# Patient Record
Sex: Female | Born: 1940 | Race: White | Hispanic: No | Marital: Married | Smoking: Never smoker
Health system: Southern US, Community
[De-identification: ages and names within clinical notes are randomized; demographics above are authoritative.]

## PROBLEM LIST (undated history)

## (undated) DIAGNOSIS — Z8619 Personal history of other infectious and parasitic diseases: Secondary | ICD-10-CM

## (undated) DIAGNOSIS — I629 Nontraumatic intracranial hemorrhage, unspecified: Secondary | ICD-10-CM

## (undated) DIAGNOSIS — Z862 Personal history of diseases of the blood and blood-forming organs and certain disorders involving the immune mechanism: Secondary | ICD-10-CM

## (undated) DIAGNOSIS — N816 Rectocele: Secondary | ICD-10-CM

## (undated) DIAGNOSIS — M549 Dorsalgia, unspecified: Secondary | ICD-10-CM

## (undated) DIAGNOSIS — I639 Cerebral infarction, unspecified: Secondary | ICD-10-CM

## (undated) DIAGNOSIS — E785 Hyperlipidemia, unspecified: Secondary | ICD-10-CM

## (undated) DIAGNOSIS — M5414 Radiculopathy, thoracic region: Secondary | ICD-10-CM

## (undated) DIAGNOSIS — G8929 Other chronic pain: Secondary | ICD-10-CM

## (undated) DIAGNOSIS — R410 Disorientation, unspecified: Secondary | ICD-10-CM

## (undated) DIAGNOSIS — Z0389 Encounter for observation for other suspected diseases and conditions ruled out: Secondary | ICD-10-CM

## (undated) DIAGNOSIS — Q211 Atrial septal defect, unspecified: Secondary | ICD-10-CM

## (undated) DIAGNOSIS — G463 Brain stem stroke syndrome: Secondary | ICD-10-CM

## (undated) HISTORY — DX: Atrial septal defect: Q21.1

## (undated) HISTORY — DX: Other chronic pain: G89.29

## (undated) HISTORY — DX: Rectocele: N81.6

## (undated) HISTORY — PX: COLONOSCOPY W/ POLYPECTOMY: SHX1380

## (undated) HISTORY — DX: Nontraumatic intracranial hemorrhage, unspecified: I62.9

## (undated) HISTORY — DX: Personal history of diseases of the blood and blood-forming organs and certain disorders involving the immune mechanism: Z86.2

## (undated) HISTORY — DX: Radiculopathy, thoracic region: M54.14

## (undated) HISTORY — PX: BLADDER NECK RECONSTRUCTION: SHX1239

## (undated) HISTORY — DX: Brain stem stroke syndrome: G46.3

## (undated) HISTORY — PX: OTHER SURGICAL HISTORY: SHX169

## (undated) HISTORY — PX: SHOULDER SURGERY: SHX246

## (undated) HISTORY — DX: Atrial septal defect, unspecified: Q21.10

## (undated) HISTORY — PX: BACK SURGERY: SHX140

## (undated) HISTORY — DX: Hyperlipidemia, unspecified: E78.5

## (undated) HISTORY — DX: Cerebral infarction, unspecified: I63.9

## (undated) HISTORY — DX: Dorsalgia, unspecified: M54.9

## (undated) HISTORY — DX: Personal history of other infectious and parasitic diseases: Z86.19

## (undated) HISTORY — DX: Disorientation, unspecified: R41.0

## (undated) HISTORY — PX: COLON SURGERY: SHX602

## (undated) HISTORY — DX: Encounter for observation for other suspected diseases and conditions ruled out: Z03.89

---

## 2001-07-05 ENCOUNTER — Encounter: Payer: Self-pay | Admitting: Orthopedic Surgery

## 2001-07-05 ENCOUNTER — Ambulatory Visit (HOSPITAL_COMMUNITY): Admission: RE | Admit: 2001-07-05 | Discharge: 2001-07-05 | Payer: Self-pay | Admitting: Orthopedic Surgery

## 2004-04-30 ENCOUNTER — Ambulatory Visit (HOSPITAL_COMMUNITY): Admission: RE | Admit: 2004-04-30 | Discharge: 2004-04-30 | Payer: Self-pay | Admitting: Neurosurgery

## 2004-05-06 ENCOUNTER — Ambulatory Visit (HOSPITAL_COMMUNITY): Admission: RE | Admit: 2004-05-06 | Discharge: 2004-05-07 | Payer: Self-pay | Admitting: Neurosurgery

## 2006-11-16 IMAGING — CT CT L SPINE W/ CM
3 of 11 series · 10 of 33 positions shown, 12 images · non-contrast
Comparison: none

CLINICAL DATA: Left flank pain.
LUMBAR MYELOGRAM:
TECHNIQUE: Instillation of contrast was performed by Dr. Aliyev. Mild levoscoliosis lumbar spine.  No specific nerve root compression.  Mild ventral impression upon the thecal sac L2-3, L3-4 and L4-5. No abnormal motion occurs between flexion and extension.

[Series 2: l-spine helical · axial · 0.27mm/px · z∈[-296,-66]mm · 2 of 93 slices shown, 3 images]
[im 1/93  soft-tissue]
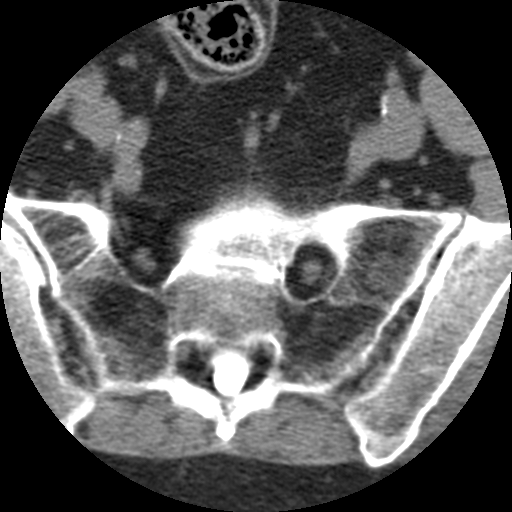
[im 1/93  bone]
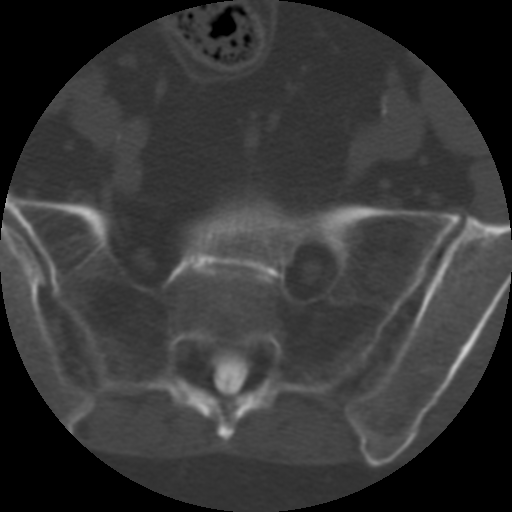
[im 93/93  bone]
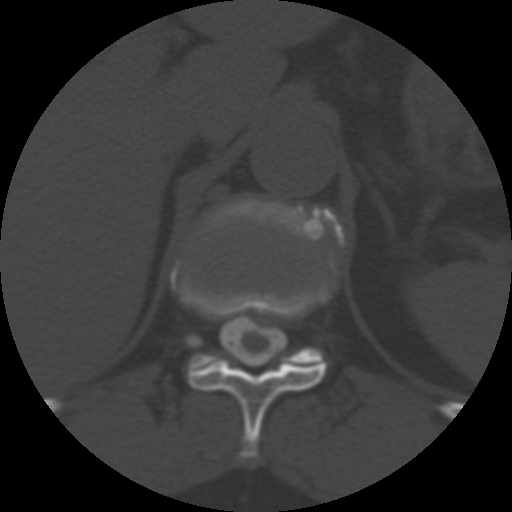

[Series 105: reformatted · sagittal · 0.39mm/px · 5 of 63 slices shown, 6 images (1 of 2)]
[im 21/63  bone]
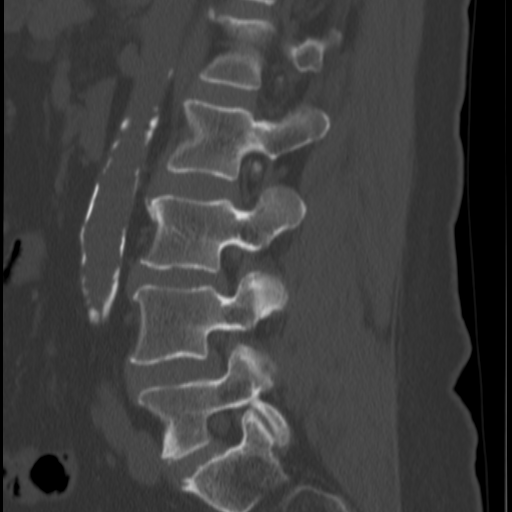
[im 26/63  bone]
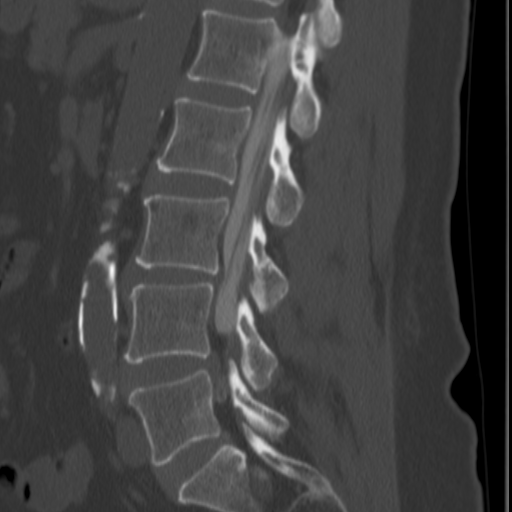
[im 32/63  soft-tissue]
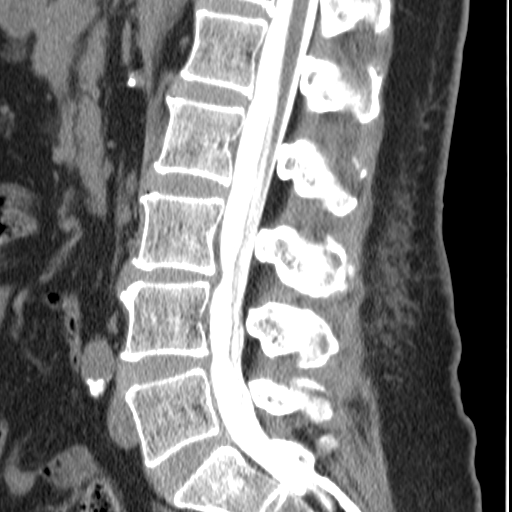
[im 32/63  bone]
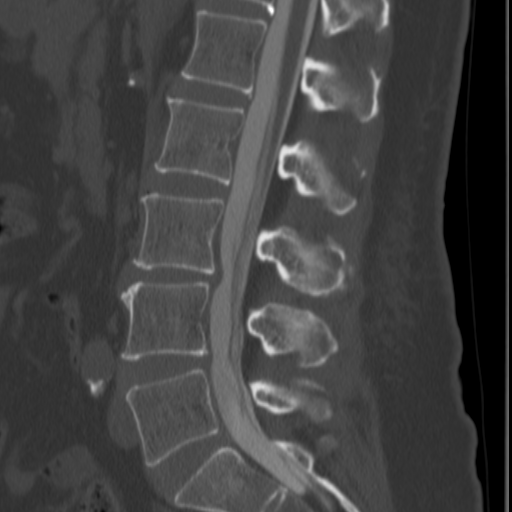
[im 37/63  bone]
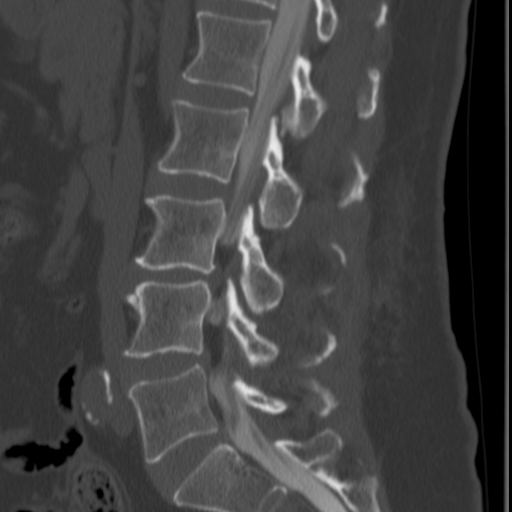
[im 42/63  bone]
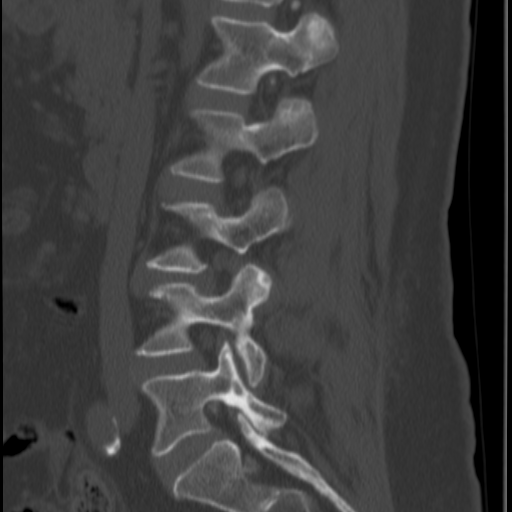

[Series 106: reformatted · coronal · 0.39mm/px · 3 of 63 slices shown (2 of 2)]
[im 13/63  bone]
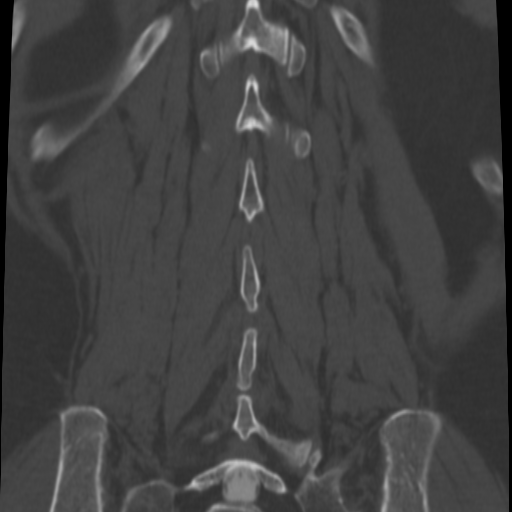
[im 25/63  bone]
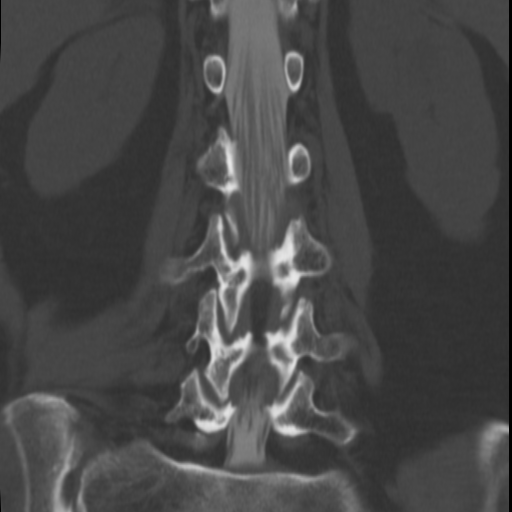
[im 38/63  bone]
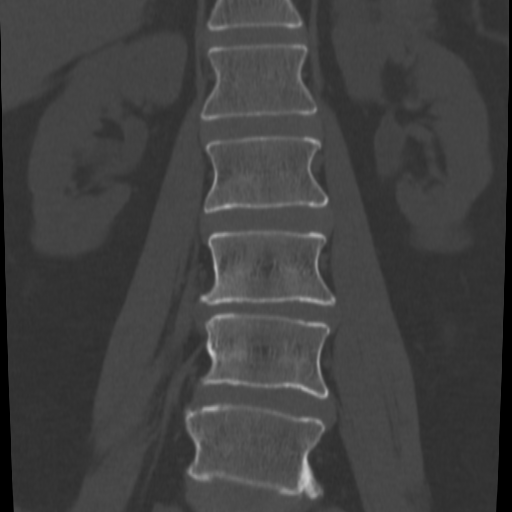

[10 of 33 positions shown; findings below may reference images not displayed]

IMPRESSION: Mild ventral impression upon the thecal sac L2-3, L3-4 and L4-5 without specific nerve root compression. No abnormal motion.   Please see CT report.
POST MYELOGRAM CT LUMBAR SPINE:
Multiplanar reformatted CT images were reconstructed from the axial CT data set. These images were reviewed and pertinent findings are included in the accompanying complete CT report. 
Present examination incorporates from lower aspect of the T11-12 disk space through the upper sacrum.  
Incidentally noted is a calcified aorta with mild ectasia. Small calcification upper pole left kidney incompletely evaluated on present exam. Conus slightly low-lying at the lower L-2 level.
T11-12: Limited imaging through this disk space reveals mild bulge as well as a slightly moderate sized left posterolateral disk protrusion with mass effect upon the adjacent thecal sac and minimal cord deformity without significant compression of such.
T12-L1:  Negative.
L1-2:  Mild bilateral facet joint degenerative changes.
L2-3:  Mild bilateral facet joint degenerative changes.
L3-4:  Moderate bulge/broad based protrusion with greater extension right lateral position. This encroaches upon the coursing exiting right L-3 nerve root which does not appear to be significantly compressed or displaced. Facet joint bony overgrowth and ligamentum flavum hypertrophy with right greater than left moderate spinal stenosis.  
L4-5:  Rotation with moderate bulge.  Additionally, there is a small extruded disk in the left foramen which extends cephalad from the disk space level towards the medial aspect of the left L-4 pedicle and is causing a mild impression upon the left L-4 nerve root. Moderate bilateral facet joint degenerative changes.  Moderate spinal stenosis (left greater than right).
L5-S1:  Moderate bulge.  Moderate bilateral facet joint degenerative changes. Minimal bilateral foraminal narrowing without nerve root compression.
IMPRESSION: In this patient who is presenting with left side symptoms, most significant finding may be the small extruded left L4-5 disk and associated mass effect as described above.
T11-12 left posterolateral slightly moderate sized disk protrusion with deformity of adjacent thecal sac and perhaps minimal impression upon the adjacent spinal cord.
L3-4 multifactorial moderate spinal stenosis, right greater than left. Please see above.

## 2010-10-14 DIAGNOSIS — I6330 Cerebral infarction due to thrombosis of unspecified cerebral artery: Secondary | ICD-10-CM

## 2010-10-14 HISTORY — DX: Cerebral infarction due to thrombosis of unspecified cerebral artery: I63.30

## 2015-04-08 DIAGNOSIS — E041 Nontoxic single thyroid nodule: Secondary | ICD-10-CM | POA: Insufficient documentation

## 2015-04-08 DIAGNOSIS — Z8774 Personal history of (corrected) congenital malformations of heart and circulatory system: Secondary | ICD-10-CM

## 2015-04-08 DIAGNOSIS — M8580 Other specified disorders of bone density and structure, unspecified site: Secondary | ICD-10-CM

## 2015-04-08 DIAGNOSIS — F339 Major depressive disorder, recurrent, unspecified: Secondary | ICD-10-CM

## 2015-04-08 DIAGNOSIS — F5101 Primary insomnia: Secondary | ICD-10-CM | POA: Insufficient documentation

## 2015-04-08 DIAGNOSIS — F419 Anxiety disorder, unspecified: Secondary | ICD-10-CM | POA: Insufficient documentation

## 2015-04-08 DIAGNOSIS — I10 Essential (primary) hypertension: Secondary | ICD-10-CM

## 2015-04-08 DIAGNOSIS — E782 Mixed hyperlipidemia: Secondary | ICD-10-CM

## 2015-04-08 DIAGNOSIS — K219 Gastro-esophageal reflux disease without esophagitis: Secondary | ICD-10-CM | POA: Insufficient documentation

## 2015-04-08 DIAGNOSIS — G43909 Migraine, unspecified, not intractable, without status migrainosus: Secondary | ICD-10-CM | POA: Insufficient documentation

## 2015-04-08 DIAGNOSIS — I2510 Atherosclerotic heart disease of native coronary artery without angina pectoris: Secondary | ICD-10-CM

## 2015-04-08 DIAGNOSIS — Z79899 Other long term (current) drug therapy: Secondary | ICD-10-CM | POA: Insufficient documentation

## 2015-04-08 HISTORY — DX: Anxiety disorder, unspecified: F41.9

## 2015-04-08 HISTORY — DX: Gastro-esophageal reflux disease without esophagitis: K21.9

## 2015-04-08 HISTORY — DX: Nontoxic single thyroid nodule: E04.1

## 2015-04-08 HISTORY — DX: Essential (primary) hypertension: I10

## 2015-04-08 HISTORY — DX: Primary insomnia: F51.01

## 2015-04-08 HISTORY — DX: Personal history of (corrected) congenital malformations of heart and circulatory system: Z87.74

## 2015-04-08 HISTORY — DX: Other long term (current) drug therapy: Z79.899

## 2015-04-08 HISTORY — DX: Other specified disorders of bone density and structure, unspecified site: M85.80

## 2015-04-08 HISTORY — DX: Major depressive disorder, recurrent, unspecified: F33.9

## 2015-04-08 HISTORY — DX: Mixed hyperlipidemia: E78.2

## 2015-04-08 HISTORY — DX: Atherosclerotic heart disease of native coronary artery without angina pectoris: I25.10

## 2015-04-08 HISTORY — DX: Migraine, unspecified, not intractable, without status migrainosus: G43.909

## 2015-06-03 DIAGNOSIS — R42 Dizziness and giddiness: Secondary | ICD-10-CM

## 2015-06-03 HISTORY — DX: Dizziness and giddiness: R42

## 2015-09-10 DIAGNOSIS — E785 Hyperlipidemia, unspecified: Secondary | ICD-10-CM | POA: Insufficient documentation

## 2015-09-10 DIAGNOSIS — Z01810 Encounter for preprocedural cardiovascular examination: Secondary | ICD-10-CM

## 2015-09-10 HISTORY — DX: Encounter for preprocedural cardiovascular examination: Z01.810

## 2015-09-11 DIAGNOSIS — R5381 Other malaise: Secondary | ICD-10-CM

## 2015-09-11 DIAGNOSIS — R5383 Other fatigue: Secondary | ICD-10-CM

## 2015-09-11 HISTORY — DX: Other malaise: R53.81

## 2015-12-13 DIAGNOSIS — K5730 Diverticulosis of large intestine without perforation or abscess without bleeding: Secondary | ICD-10-CM | POA: Insufficient documentation

## 2015-12-13 HISTORY — DX: Diverticulosis of large intestine without perforation or abscess without bleeding: K57.30

## 2016-09-16 DIAGNOSIS — Z8673 Personal history of transient ischemic attack (TIA), and cerebral infarction without residual deficits: Secondary | ICD-10-CM

## 2016-09-16 DIAGNOSIS — A6000 Herpesviral infection of urogenital system, unspecified: Secondary | ICD-10-CM

## 2016-09-16 HISTORY — DX: Personal history of transient ischemic attack (TIA), and cerebral infarction without residual deficits: Z86.73

## 2016-09-16 HISTORY — DX: Herpesviral infection of urogenital system, unspecified: A60.00

## 2016-12-09 DIAGNOSIS — K5909 Other constipation: Secondary | ICD-10-CM

## 2016-12-09 HISTORY — DX: Other constipation: K59.09

## 2017-03-19 DIAGNOSIS — M5136 Other intervertebral disc degeneration, lumbar region: Secondary | ICD-10-CM | POA: Insufficient documentation

## 2017-03-19 HISTORY — DX: Other intervertebral disc degeneration, lumbar region: M51.36

## 2018-04-14 ENCOUNTER — Encounter: Payer: Self-pay | Admitting: Cardiology

## 2018-04-14 ENCOUNTER — Ambulatory Visit: Payer: Medicare Other | Admitting: Cardiology

## 2018-04-14 VITALS — BP 124/62 | HR 53 | Ht 65.0 in | Wt 165.0 lb

## 2018-04-14 DIAGNOSIS — E782 Mixed hyperlipidemia: Secondary | ICD-10-CM | POA: Diagnosis not present

## 2018-04-14 DIAGNOSIS — I10 Essential (primary) hypertension: Secondary | ICD-10-CM

## 2018-04-14 DIAGNOSIS — I2510 Atherosclerotic heart disease of native coronary artery without angina pectoris: Secondary | ICD-10-CM

## 2018-04-14 DIAGNOSIS — Z8774 Personal history of (corrected) congenital malformations of heart and circulatory system: Secondary | ICD-10-CM

## 2018-04-14 DIAGNOSIS — I209 Angina pectoris, unspecified: Secondary | ICD-10-CM

## 2018-04-14 HISTORY — DX: Angina pectoris, unspecified: I20.9

## 2018-04-14 LAB — BASIC METABOLIC PANEL
BUN/Creatinine Ratio: 18 (ref 12–28)
BUN: 17 mg/dL (ref 8–27)
CO2: 21 mmol/L (ref 20–29)
Calcium: 9.8 mg/dL (ref 8.7–10.3)
Chloride: 104 mmol/L (ref 96–106)
Creatinine, Ser: 0.97 mg/dL (ref 0.57–1.00)
GFR calc Af Amer: 65 mL/min/{1.73_m2} (ref >59)
GFR, EST NON AFRICAN AMERICAN: 56 mL/min/{1.73_m2} — AB (ref >59)
Glucose: 98 mg/dL (ref 65–99)
POTASSIUM: 4.6 mmol/L (ref 3.5–5.2)
Sodium: 138 mmol/L (ref 134–144)

## 2018-04-14 LAB — CBC
Hematocrit: 38.9 % (ref 34.0–46.6)
Hemoglobin: 13.3 g/dL (ref 11.1–15.9)
MCH: 30.3 pg (ref 26.6–33.0)
MCHC: 34.2 g/dL (ref 31.5–35.7)
MCV: 89 fL (ref 79–97)
Platelets: 256 10*3/uL (ref 150–450)
RBC: 4.39 x10E6/uL (ref 3.77–5.28)
RDW: 13.5 % (ref 11.7–15.4)
WBC: 5.3 10*3/uL (ref 3.4–10.8)

## 2018-04-14 MED ORDER — NITROGLYCERIN 0.4 MG SL SUBL: 0.4000 mg | SUBLINGUAL_TABLET | SUBLINGUAL | 11 refills | Status: DC | PRN

## 2018-04-14 NOTE — H&P (View-Only) (Signed)
Cardiology Office Note:    Date:  04/14/2018   ID:  Megan SchillerBarbara L Mcgrath, DOB 05/04/1940, MRN [id number]  PCP:  [name], [name], MD  Cardiologist:  Garwin Brothersajan R Janayia Burggraf, MD   Referring MD: [name], [name], MD    ASSESSMENT:    1. Angina pectoris (HCC)   2. Essential (primary) hypertension   3. Mixed hyperlipidemia   4. S/P percutaneous patent foramen ovale closure   5. Coronary artery disease involving native coronary artery of native heart, angina presence unspecified    PLAN:    In order of problems listed above:  1. Secondary prevention stressed with the patient.  Importance of compliance with diet and medication stressed and she vocalized understanding. 2. Diet was discussed for dyslipidemia and she is on statin therapy.  Her blood pressure is good I cannot initiate her on beta-blocker because she is already having a low heart rate. 3. Her symptoms are very concerning.In view of the patient's symptoms, I discussed with the patient options for evaluation. Invasive and noninvasive options were given to the patient. I discussed stress testing and coronary angiography and left heart catheterization at length. Benefits, pros and cons of each approach were discussed at length. Patient had multiple questions which were answered to the patient's satisfaction. Patient opted for invasive evaluation and we will set up for coronary angiography and left heart catheterization. Further recommendations will be made based on the findings with coronary angiography. In the interim if the patient has any significant symptoms in hospital to the nearest emergency room.    Medication Adjustments/Labs and Tests Ordered: Current medicines are reviewed at length with the patient today.  Concerns regarding medicines are outlined above.  No orders of the defined types were placed in this encounter.  No orders of the defined types were placed in this encounter.    No chief complaint on file.    History of  Present Illness:    Megan Mcgrath is a 78 y.o. female.  This patient has been under my care in my previous practice.  He is here now to transfer his care and be established with my current practice.  He has history of coronary artery disease nonobstructive in nature about 4 years ago by coronary angiography.  She has essential hypertension and dyslipidemia.  She is referred here as she complains of chest tightness on exertion.  She tells me that consistently when she goes up to her mailbox she has chest tightness going to the neck.  She has had nitroglycerin and used it with relief.  For this reason she is here for an evaluation.  At the time of my evaluation, the patient is alert awake oriented and in no distress.  Past Medical History:  Diagnosis Date  . Atrial septal defect   . Brain stem stroke syndrome   . Chronic back pain   . Delirium   . Dyslipidemia   . H/O herpes genitalis   . History of anemia   . Nontraumatic intracranial hemorrhage (HCC)   . Observation for suspected malignant neoplasm   . Rectocele   . Stroke (HCC)   . Thoracic neuritis     Past Surgical History:  Procedure Laterality Date  . BACK SURGERY    . BLADDER NECK RECONSTRUCTION    . COLON SURGERY    . COLONOSCOPY W/ POLYPECTOMY    . HYSTERECTOMY    . repair congenital atrial septal defect    . SHOULDER SURGERY      Current  Medications: Current Meds  Medication Sig  . acebutolol (SECTRAL) 200 MG capsule Take 200 mg by mouth 2 (two) times daily.  . ACETAMINOPHEN-BUTALBITAL 50-325 MG TABS 1\2 tablet twice a day  . ALPRAZolam (XANAX) 0.5 MG tablet Take 0.5 mg by mouth every 8 (eight) hours as needed.  Marland Kitchen aspirin 81 MG tablet Take 81 mg by mouth daily.  Marland Kitchen atorvastatin (LIPITOR) 20 MG tablet Take 20 mg by mouth daily.  . Calcium Carbonate-Vitamin D (CALCIUM HIGH POTENCY/VITAMIN D) 600-200 MG-UNIT TABS Take 1 tablet by mouth daily.  . Cholecalciferol (VITAMIN D-1000 MAX ST) 25 MCG (1000 UT) tablet Take 1,000  Units by mouth daily.  Marland Kitchen lisinopril (PRINIVIL,ZESTRIL) 10 MG tablet Take 10 mg by mouth 2 (two) times daily.  . meclizine (ANTIVERT) 25 MG tablet Take 25 mg by mouth every 8 (eight) hours as needed.  . nitroGLYCERIN (NITROSTAT) 0.4 MG SL tablet Place 0.4 mg under the tongue every 5 (five) minutes as needed.  . Omega-3 1000 MG CAPS Take 1,000 capsules by mouth daily.  Marland Kitchen omeprazole (PRILOSEC) 40 MG capsule Take 40 mg by mouth daily.  . valACYclovir (VALTREX) 500 MG tablet Take 500 mg by mouth 2 (two) times daily.     Allergies:   Patient has no known allergies.   Social History   Socioeconomic History  . Marital status: Married    Spouse name: Not on file  . Number of children: Not on file  . Years of education: Not on file  . Highest education level: Not on file  Occupational History  . Not on file  Social Needs  . Financial resource strain: Not on file  . Food insecurity:    Worry: Not on file    Inability: Not on file  . Transportation needs:    Medical: Not on file    Non-medical: Not on file  Tobacco Use  . Smoking status: Never Smoker  . Smokeless tobacco: Never Used  Substance and Sexual Activity  . Alcohol use: Never    Frequency: Never  . Drug use: Never  . Sexual activity: Not on file  Lifestyle  . Physical activity:    Days per week: Not on file    Minutes per session: Not on file  . Stress: Not on file  Relationships  . Social connections:    Talks on phone: Not on file    Gets together: Not on file    Attends religious service: Not on file    Active member of club or organization: Not on file    Attends meetings of clubs or organizations: Not on file    Relationship status: Not on file  Other Topics Concern  . Not on file  Social History Narrative  . Not on file     Family History: The patient's family history includes Heart disease in her father, maternal grandmother, and mother.  ROS:   Please see the history of present illness.    All other  systems reviewed and are negative.  EKGs/Labs/Other Studies Reviewed:    The following studies were reviewed today: I discussed my findings with the patient at extensive length.  EKG reveals sinus rhythm and nonspecific ST-T changes.   Recent Labs: No results found for requested labs within last 8760 hours.  Recent Lipid Panel No results found for: CHOL, TRIG, HDL, CHOLHDL, VLDL, LDLCALC, LDLDIRECT  Physical Exam:    VS:  BP 124/62 (BP Location: Right Arm, Patient Position: Sitting, Cuff Size: Normal)   Pulse Marland Kitchen)  53   Ht 5\' 5"  (1.651 m)   Wt 165 lb (74.8 kg)   SpO2 98%   BMI 27.46 kg/m     Wt Readings from Last 3 Encounters:  04/14/18 165 lb (74.8 kg)     GEN: Patient is in no acute distress HEENT: Normal NECK: No JVD; No carotid bruits LYMPHATICS: No lymphadenopathy CARDIAC: Hear sounds regular, 2/6 systolic murmur at the apex. RESPIRATORY:  Clear to auscultation without rales, wheezing or rhonchi  ABDOMEN: Soft, non-tender, non-distended MUSCULOSKELETAL:  No edema; No deformity  SKIN: Warm and dry NEUROLOGIC:  Alert and oriented x 3 PSYCHIATRIC:  Normal affect   Signed, Garwin Brothers, MD  04/14/2018 9:24 AM    [facility]

## 2018-04-14 NOTE — Patient Instructions (Signed)
Medication Instructions:   Your physician recommends that you continue on your current medications as directed. Please refer to the Current Medication list given to you today.   If you need a refill on your cardiac medications before your next appointment, please call your pharmacy.   Lab work:  Your physician recommends that you return for lab work today: BMP, CBC   If you have labs (blood work) drawn today and your tests are completely normal, you will receive your results only by: Marland Kitchen MyChart Message (if you have MyChart) OR . A paper copy in the mail If you have any lab test that is abnormal or we need to change your treatment, we will call you to review the results.  Testing/Procedures:  A chest x-ray takes a picture of the organs and structures inside the chest, including the heart, lungs, and blood vessels. This test can show several things, including, whether the heart is enlarges; whether fluid is building up in the lungs; and whether pacemaker / defibrillator leads are still in place.     [facility] CARDIOVASCULAR DIVISION [facility] [place] [address] [place] [address] Dept: [phone] Loc: [phone]  Megan Mcgrath  04/14/2018  You are scheduled for a Cardiac Catheterization on Tuesday, February 18 with Dr. Lance Muss.  1. Please arrive at the [facility] (Main Entrance A) at [facility]: [address] [place], [place] [address] at 7:00 AM (This time is two hours before your procedure to ensure your preparation). Free valet parking service is available.   Special note: Every effort is made to have your procedure done on time. Please understand that emergencies sometimes delay scheduled procedures.  2. Diet: Do not eat solid foods after midnight.  The patient may have clear liquids until 5am upon the day of the procedure.  3. Labs: You will need to have blood drawn today: CBC, BMP  4. Medication  instructions in preparation for your procedure:   Contrast Allergy: No   On the morning of your procedure, take your Aspirin and any morning medicines NOT listed above.  You may use sips of water.  5. Plan for one night stay--bring personal belongings. 6. Bring a current list of your medications and current insurance cards. 7. You MUST have a responsible person to drive you home. 8. Someone MUST be with you the first 24 hours after you arrive home or your discharge will be delayed. 9. Please wear clothes that are easy to get on and off and wear slip-on shoes.  Thank you for allowing [place] to care for you!   -- [facility]   Follow-Up: At [facility], you and your health needs are our priority.  As part of our continuing mission to provide you with exceptional heart care, we have created designated Provider Care Teams.  These Care Teams include your primary Cardiologist (physician) and Advanced Practice Providers (APPs -  Physician Assistants and Nurse Practitioners) who all work together to provide you with the care you need, when you need it.   You will need a follow up appointment in 1 months.

## 2018-04-14 NOTE — Progress Notes (Signed)
Cardiology Office Note:    Date:  04/14/2018   ID:  Megan Mcgrath, DOB 05/09/1940, MRN [id number]  PCP:  Grisso, [name], MD  Cardiologist:  Anias Bartol R Nathanyal Ashmead, MD   Referring MD: Grisso, [name], MD    ASSESSMENT:    1. Angina pectoris (HCC)   2. Essential (primary) hypertension   3. Mixed hyperlipidemia   4. S/P percutaneous patent foramen ovale closure   5. Coronary artery disease involving native coronary artery of native heart, angina presence unspecified    PLAN:    In order of problems listed above:  1. Secondary prevention stressed with the patient.  Importance of compliance with diet and medication stressed and she vocalized understanding. 2. Diet was discussed for dyslipidemia and she is on statin therapy.  Her blood pressure is good I cannot initiate her on beta-blocker because she is already having a low heart rate. 3. Her symptoms are very concerning.In view of the patient's symptoms, I discussed with the patient options for evaluation. Invasive and noninvasive options were given to the patient. I discussed stress testing and coronary angiography and left heart catheterization at length. Benefits, pros and cons of each approach were discussed at length. Patient had multiple questions which were answered to the patient's satisfaction. Patient opted for invasive evaluation and we will set up for coronary angiography and left heart catheterization. Further recommendations will be made based on the findings with coronary angiography. In the interim if the patient has any significant symptoms in hospital to the nearest emergency room.    Medication Adjustments/Labs and Tests Ordered: Current medicines are reviewed at length with the patient today.  Concerns regarding medicines are outlined above.  No orders of the defined types were placed in this encounter.  No orders of the defined types were placed in this encounter.    No chief complaint on file.    History of  Present Illness:    Megan Mcgrath is a 78 y.o. female.  This patient has been under my care in my previous practice.  He is here now to transfer his care and be established with my current practice.  He has history of coronary artery disease nonobstructive in nature about 4 years ago by coronary angiography.  She has essential hypertension and dyslipidemia.  She is referred here as she complains of chest tightness on exertion.  She tells me that consistently when she goes up to her mailbox she has chest tightness going to the neck.  She has had nitroglycerin and used it with relief.  For this reason she is here for an evaluation.  At the time of my evaluation, the patient is alert awake oriented and in no distress.  Past Medical History:  Diagnosis Date  . Atrial septal defect   . Brain stem stroke syndrome   . Chronic back pain   . Delirium   . Dyslipidemia   . H/O herpes genitalis   . History of anemia   . Nontraumatic intracranial hemorrhage (HCC)   . Observation for suspected malignant neoplasm   . Rectocele   . Stroke (HCC)   . Thoracic neuritis     Past Surgical History:  Procedure Laterality Date  . BACK SURGERY    . BLADDER NECK RECONSTRUCTION    . COLON SURGERY    . COLONOSCOPY W/ POLYPECTOMY    . HYSTERECTOMY    . repair congenital atrial septal defect    . SHOULDER SURGERY      Current   Medications: Current Meds  Medication Sig  . acebutolol (SECTRAL) 200 MG capsule Take 200 mg by mouth 2 (two) times daily.  . ACETAMINOPHEN-BUTALBITAL 50-325 MG TABS 1\2 tablet twice a day  . ALPRAZolam (XANAX) 0.5 MG tablet Take 0.5 mg by mouth every 8 (eight) hours as needed.  . aspirin 81 MG tablet Take 81 mg by mouth daily.  . atorvastatin (LIPITOR) 20 MG tablet Take 20 mg by mouth daily.  . Calcium Carbonate-Vitamin D (CALCIUM HIGH POTENCY/VITAMIN D) 600-200 MG-UNIT TABS Take 1 tablet by mouth daily.  . Cholecalciferol (VITAMIN D-1000 MAX ST) 25 MCG (1000 UT) tablet Take 1,000  Units by mouth daily.  . lisinopril (PRINIVIL,ZESTRIL) 10 MG tablet Take 10 mg by mouth 2 (two) times daily.  . meclizine (ANTIVERT) 25 MG tablet Take 25 mg by mouth every 8 (eight) hours as needed.  . nitroGLYCERIN (NITROSTAT) 0.4 MG SL tablet Place 0.4 mg under the tongue every 5 (five) minutes as needed.  . Omega-3 1000 MG CAPS Take 1,000 capsules by mouth daily.  . omeprazole (PRILOSEC) 40 MG capsule Take 40 mg by mouth daily.  . valACYclovir (VALTREX) 500 MG tablet Take 500 mg by mouth 2 (two) times daily.     Allergies:   Patient has no known allergies.   Social History   Socioeconomic History  . Marital status: Married    Spouse name: Not on file  . Number of children: Not on file  . Years of education: Not on file  . Highest education level: Not on file  Occupational History  . Not on file  Social Needs  . Financial resource strain: Not on file  . Food insecurity:    Worry: Not on file    Inability: Not on file  . Transportation needs:    Medical: Not on file    Non-medical: Not on file  Tobacco Use  . Smoking status: Never Smoker  . Smokeless tobacco: Never Used  Substance and Sexual Activity  . Alcohol use: Never    Frequency: Never  . Drug use: Never  . Sexual activity: Not on file  Lifestyle  . Physical activity:    Days per week: Not on file    Minutes per session: Not on file  . Stress: Not on file  Relationships  . Social connections:    Talks on phone: Not on file    Gets together: Not on file    Attends religious service: Not on file    Active member of club or organization: Not on file    Attends meetings of clubs or organizations: Not on file    Relationship status: Not on file  Other Topics Concern  . Not on file  Social History Narrative  . Not on file     Family History: The patient's family history includes Heart disease in her father, maternal grandmother, and mother.  ROS:   Please see the history of present illness.    All other  systems reviewed and are negative.  EKGs/Labs/Other Studies Reviewed:    The following studies were reviewed today: I discussed my findings with the patient at extensive length.  EKG reveals sinus rhythm and nonspecific ST-T changes.   Recent Labs: No results found for requested labs within last 8760 hours.  Recent Lipid Panel No results found for: CHOL, TRIG, HDL, CHOLHDL, VLDL, LDLCALC, LDLDIRECT  Physical Exam:    VS:  BP 124/62 (BP Location: Right Arm, Patient Position: Sitting, Cuff Size: Normal)   Pulse (!)   53   Ht 5' 5" (1.651 m)   Wt 165 lb (74.8 kg)   SpO2 98%   BMI 27.46 kg/m     Wt Readings from Last 3 Encounters:  04/14/18 165 lb (74.8 kg)     GEN: Patient is in no acute distress HEENT: Normal NECK: No JVD; No carotid bruits LYMPHATICS: No lymphadenopathy CARDIAC: Hear sounds regular, 2/6 systolic murmur at the apex. RESPIRATORY:  Clear to auscultation without rales, wheezing or rhonchi  ABDOMEN: Soft, non-tender, non-distended MUSCULOSKELETAL:  No edema; No deformity  SKIN: Warm and dry NEUROLOGIC:  Alert and oriented x 3 PSYCHIATRIC:  Normal affect   Signed, Thereasa Iannello R Lin Glazier, MD  04/14/2018 9:24 AM    [facility]  

## 2018-04-18 ENCOUNTER — Telehealth: Payer: Self-pay | Admitting: *Deleted

## 2018-04-18 NOTE — Telephone Encounter (Signed)
Pt contacted pre-catheterization scheduled at [facility] for: Tuesday April 19, 2018 9 AM Verified arrival time and place: [facility] Main Entrance A at: 7 AM  No solid food after midnight prior to cath, clear liquids until 5 AM day of procedure.  Hold: Lisinopril-day before and day of procedure-GFR 56  Except hold medications AM meds can be  taken pre-cath with sip of water including: ASA 81 mg  Confirmed patient has responsible person to drive home post procedure and observe 24 hours after arriving home: yes  I encouraged patient to increase water intake today.  Pt mentioned that she had a dry cough for more than 1 year, pt denies fever/ productive cough. Pt advised  cough can be a side effect of lisinopril and alternative could be prescribed. I suggested pt call prescribing provider to let them know about cough possibly related to lisinopril. Pt states Dr Shary Decamp prescribed lisinopril, she will call his office today and discuss cough /possible side effect of lisinopril. Pt advised not to stop lisinopril unless advised by prescribing provider.  Pt verbalized understanding, thanked me for call.

## 2018-04-19 ENCOUNTER — Encounter (HOSPITAL_COMMUNITY)
Admission: RE | Disposition: A | Discharge status: Home / Self Care | Payer: Self-pay | Source: Home / Self Care | Attending: Interventional Cardiology

## 2018-04-19 ENCOUNTER — Other Ambulatory Visit: Payer: Self-pay

## 2018-04-19 ENCOUNTER — Ambulatory Visit (HOSPITAL_COMMUNITY)
Admission: RE | Admit: 2018-04-19 | Discharge: 2018-04-19 | Disposition: A | Discharge status: Home / Self Care | Payer: Medicare Other | Attending: Interventional Cardiology | Admitting: Interventional Cardiology

## 2018-04-19 DIAGNOSIS — Z8673 Personal history of transient ischemic attack (TIA), and cerebral infarction without residual deficits: Secondary | ICD-10-CM | POA: Insufficient documentation

## 2018-04-19 DIAGNOSIS — Z7982 Long term (current) use of aspirin: Secondary | ICD-10-CM | POA: Insufficient documentation

## 2018-04-19 DIAGNOSIS — Z8249 Family history of ischemic heart disease and other diseases of the circulatory system: Secondary | ICD-10-CM | POA: Insufficient documentation

## 2018-04-19 DIAGNOSIS — Z90710 Acquired absence of both cervix and uterus: Secondary | ICD-10-CM | POA: Insufficient documentation

## 2018-04-19 DIAGNOSIS — I209 Angina pectoris, unspecified: Secondary | ICD-10-CM

## 2018-04-19 DIAGNOSIS — I25119 Atherosclerotic heart disease of native coronary artery with unspecified angina pectoris: Secondary | ICD-10-CM | POA: Insufficient documentation

## 2018-04-19 DIAGNOSIS — Z8774 Personal history of (corrected) congenital malformations of heart and circulatory system: Secondary | ICD-10-CM | POA: Diagnosis not present

## 2018-04-19 DIAGNOSIS — E782 Mixed hyperlipidemia: Secondary | ICD-10-CM | POA: Insufficient documentation

## 2018-04-19 DIAGNOSIS — I2510 Atherosclerotic heart disease of native coronary artery without angina pectoris: Secondary | ICD-10-CM

## 2018-04-19 DIAGNOSIS — I10 Essential (primary) hypertension: Secondary | ICD-10-CM | POA: Insufficient documentation

## 2018-04-19 DIAGNOSIS — Z79899 Other long term (current) drug therapy: Secondary | ICD-10-CM | POA: Insufficient documentation

## 2018-04-19 HISTORY — PX: LEFT HEART CATH AND CORONARY ANGIOGRAPHY: CATH118249

## 2018-04-19 HISTORY — PX: INTRAVASCULAR PRESSURE WIRE/FFR STUDY: CATH118243

## 2018-04-19 LAB — POCT ACTIVATED CLOTTING TIME: Activated Clotting Time: 285 seconds

## 2018-04-19 SURGERY — LEFT HEART CATH AND CORONARY ANGIOGRAPHY
Anesthesia: LOCAL

## 2018-04-19 MED ORDER — HEPARIN (PORCINE) IN NACL 1000-0.9 UT/500ML-% IV SOLN
INTRAVENOUS | Status: AC
  Filled 2018-04-19: qty 500

## 2018-04-19 MED ORDER — HEPARIN SODIUM (PORCINE) 1000 UNIT/ML IJ SOLN
INTRAMUSCULAR | Status: DC | PRN
  Administered 2018-04-19: 4000 [IU] via INTRAVENOUS
  Administered 2018-04-19: 5000 [IU] via INTRAVENOUS

## 2018-04-19 MED ORDER — SODIUM CHLORIDE 0.9% FLUSH: 3.0000 mL | Freq: Two times a day (BID) | INTRAVENOUS | Status: DC

## 2018-04-19 MED ORDER — HEPARIN (PORCINE) IN NACL 1000-0.9 UT/500ML-% IV SOLN
INTRAVENOUS | Status: DC | PRN
  Administered 2018-04-19: 500 mL

## 2018-04-19 MED ORDER — LIDOCAINE HCL (PF) 1 % IJ SOLN
INTRAMUSCULAR | Status: AC
  Filled 2018-04-19: qty 30

## 2018-04-19 MED ORDER — SODIUM CHLORIDE 0.9 % IV SOLN: 250.0000 mL | INTRAVENOUS | Status: DC | PRN

## 2018-04-19 MED ORDER — ASPIRIN 81 MG PO CHEW: 81.0000 mg | CHEWABLE_TABLET | ORAL | Status: DC

## 2018-04-19 MED ORDER — SODIUM CHLORIDE 0.9 % WEIGHT BASED INFUSION: 1.0000 mL/kg/h | INTRAVENOUS | Status: DC

## 2018-04-19 MED ORDER — LIDOCAINE HCL (PF) 1 % IJ SOLN
INTRAMUSCULAR | Status: DC | PRN
  Administered 2018-04-19: 2 mL

## 2018-04-19 MED ORDER — ADENOSINE (DIAGNOSTIC) 140MCG/KG/MIN
INTRAVENOUS | Status: DC | PRN
  Administered 2018-04-19: 140 ug/kg/min via INTRAVENOUS

## 2018-04-19 MED ORDER — SODIUM CHLORIDE 0.9 % WEIGHT BASED INFUSION
3.0000 mL/kg/h | INTRAVENOUS | Status: AC
  Administered 2018-04-19: 3 mL/kg/h via INTRAVENOUS

## 2018-04-19 MED ORDER — MIDAZOLAM HCL 2 MG/2ML IJ SOLN
INTRAMUSCULAR | Status: DC | PRN
  Administered 2018-04-19: 2 mg via INTRAVENOUS

## 2018-04-19 MED ORDER — ADENOSINE 12 MG/4ML IV SOLN
INTRAVENOUS | Status: AC
  Filled 2018-04-19: qty 16

## 2018-04-19 MED ORDER — SODIUM CHLORIDE 0.9% FLUSH: 3.0000 mL | INTRAVENOUS | Status: DC | PRN

## 2018-04-19 MED ORDER — VERAPAMIL HCL 2.5 MG/ML IV SOLN
INTRAVENOUS | Status: DC | PRN
  Administered 2018-04-19 (×2): 10 mL via INTRA_ARTERIAL

## 2018-04-19 MED ORDER — ACETAMINOPHEN 325 MG PO TABS: 650.0000 mg | ORAL_TABLET | ORAL | Status: DC | PRN

## 2018-04-19 MED ORDER — MIDAZOLAM HCL 2 MG/2ML IJ SOLN
INTRAMUSCULAR | Status: AC
  Filled 2018-04-19: qty 2

## 2018-04-19 MED ORDER — HEPARIN SODIUM (PORCINE) 1000 UNIT/ML IJ SOLN
INTRAMUSCULAR | Status: AC
  Filled 2018-04-19: qty 1

## 2018-04-19 MED ORDER — VERAPAMIL HCL 2.5 MG/ML IV SOLN
INTRAVENOUS | Status: AC
  Filled 2018-04-19: qty 2

## 2018-04-19 MED ORDER — IOHEXOL 350 MG/ML SOLN
INTRAVENOUS | Status: DC | PRN
  Administered 2018-04-19: 80 mL via INTRA_ARTERIAL

## 2018-04-19 MED ORDER — FENTANYL CITRATE (PF) 100 MCG/2ML IJ SOLN
INTRAMUSCULAR | Status: AC
  Filled 2018-04-19: qty 2

## 2018-04-19 MED ORDER — SODIUM CHLORIDE 0.9 % IV SOLN: INTRAVENOUS | Status: DC

## 2018-04-19 MED ORDER — ONDANSETRON HCL 4 MG/2ML IJ SOLN: 4.0000 mg | Freq: Four times a day (QID) | INTRAMUSCULAR | Status: DC | PRN

## 2018-04-19 MED ORDER — FENTANYL CITRATE (PF) 100 MCG/2ML IJ SOLN
INTRAMUSCULAR | Status: DC | PRN
  Administered 2018-04-19: 25 ug via INTRAVENOUS

## 2018-04-19 SURGICAL SUPPLY — 14 items
CATH 5FR JL3.5 JR4 ANG PIG MP (CATHETERS) ×2 IMPLANT
CATH LAUNCHER 6FR JR4 (CATHETERS) ×2 IMPLANT
DEVICE RAD COMP TR BAND LRG (VASCULAR PRODUCTS) ×2 IMPLANT
GLIDESHEATH SLEND SS 6F .021 (SHEATH) ×2 IMPLANT
GUIDEWIRE INQWIRE 1.5J.035X260 (WIRE) ×1 IMPLANT
GUIDEWIRE PRESSURE COMET II (WIRE) ×2 IMPLANT
INQWIRE 1.5J .035X260CM (WIRE) ×2
KIT HEART LEFT (KITS) ×2 IMPLANT
KIT HEMO VALVE WATCHDOG (MISCELLANEOUS) ×2 IMPLANT
PACK CARDIAC CATHETERIZATION (CUSTOM PROCEDURE TRAY) ×2 IMPLANT
SHEATH PROBE COVER 6X72 (BAG) ×2 IMPLANT
TRANSDUCER W/STOPCOCK (MISCELLANEOUS) ×2 IMPLANT
TUBING CIL FLEX 10 FLL-RA (TUBING) ×2 IMPLANT
WIRE HI TORQ VERSACORE-J 145CM (WIRE) ×2 IMPLANT

## 2018-04-19 NOTE — Discharge Instructions (Signed)
Drink plenty of fluids over next 48 hours and keep right wrist elevated at heart level for 24 hours ° °Radial Site Care ° °This sheet gives you information about how to care for yourself after your procedure. Your health care provider may also give you more specific instructions. If you have problems or questions, contact your health care provider. °What can I expect after the procedure? °After the procedure, it is common to have: °· Bruising and tenderness at the catheter insertion area. °Follow these instructions at home: °Medicines °· Take over-the-counter and prescription medicines only as told by your health care provider. °Insertion site care °· Follow instructions from your health care provider about how to take care of your insertion site. Make sure you: °? Wash your hands with soap and water before you change your bandage (dressing). If soap and water are not available, use hand sanitizer. °? Remove your dressing as told by your health care provider. In 24-48 hours °· Check your insertion site every day for signs of infection. Check for: °? Redness, swelling, or pain. °? Fluid or blood. °? Pus or a bad smell. °? Warmth. °· Do not take baths, swim, or use a hot tub until your health care provider approves. °· You may shower 24-48 hours after the procedure, or as directed by your health care provider. °? Remove the dressing and gently wash the site with plain soap and water. °? Pat the area dry with a clean towel. °? Do not rub the site. That could cause bleeding. °· Do not apply powder or lotion to the site. °Activity ° °· For 24 hours after the procedure, or as directed by your health care provider: °? Do not flex or bend the affected arm. °? Do not push or pull heavy objects with the affected arm. °? Do not drive yourself home from the hospital or clinic. You may drive 24 hours after the procedure unless your health care provider tells you not to. °? Do not operate machinery or power tools. °· Do not lift  anything that is heavier than 10 lb (4.5 kg), or the limit that you are told, until your health care provider says that it is safe. For 5 days °· Ask your health care provider when it is okay to: °? Return to work or school. °? Resume usual physical activities or sports. °? Resume sexual activity. °General instructions °· If the catheter site starts to bleed, raise your arm and put firm pressure on the site. If the bleeding does not stop, get help right away. This is a medical emergency. °· If you went home on the same day as your procedure, a responsible adult should be with you for the first 24 hours after you arrive home. °· Keep all follow-up visits as told by your health care provider. This is important. °Contact a health care provider if: °· You have a fever. °· You have redness, swelling, or yellow drainage around your insertion site. °Get help right away if: °· You have unusual pain at the radial site. °· The catheter insertion area swells very fast. °· The insertion area is bleeding, and the bleeding does not stop when you hold steady pressure on the area. °· Your arm or hand becomes pale, cool, tingly, or numb. °These symptoms may represent a serious problem that is an emergency. Do not wait to see if the symptoms will go away. Get medical help right away. Call your local emergency services (911 in the [place] Do not   drive yourself to the hospital. °Summary °· After the procedure, it is common to have bruising and tenderness at the site. °· Follow instructions from your health care provider about how to take care of your radial site wound. Check the wound every day for signs of infection. °· Do not lift anything that is heavier than 10 lb (4.5 kg), or the limit that you are told, until your health care provider says that it is safe. °This information is not intended to replace advice given to you by your health care provider. Make sure you discuss any questions you have with your health care  provider. °Document Released: 03/21/2010 Document Revised: 03/24/2017 Document Reviewed: 03/24/2017 °Elsevier Interactive Patient Education © 2019 [employer] ° °

## 2018-04-19 NOTE — Interval H&P Note (Signed)
Cath Lab Visit (complete for each Cath Lab visit)  Clinical Evaluation Leading to the Procedure:   ACS: No.  Non-ACS:    Anginal Classification: CCS III  Anti-ischemic medical therapy: Minimal Therapy (1 class of medications)  Non-Invasive Test Results: No non-invasive testing performed  Prior CABG: No previous CABG      History and Physical Interval Note:  04/19/2018 9:14 AM  Marthann Schiller  has presented today for surgery, with the diagnosis of ua  The various methods of treatment have been discussed with the patient and family. After consideration of risks, benefits and other options for treatment, the patient has consented to  Procedure(s): LEFT HEART CATH AND CORONARY ANGIOGRAPHY (N/A) as a surgical intervention .  The patient's history has been reviewed, patient examined, no change in status, stable for surgery.  I have reviewed the patient's chart and labs.  Questions were answered to the patient's satisfaction.     Lance Muss

## 2018-04-20 ENCOUNTER — Encounter (HOSPITAL_COMMUNITY): Payer: Self-pay | Admitting: Interventional Cardiology

## 2018-05-02 ENCOUNTER — Telehealth: Payer: Self-pay | Admitting: Cardiology

## 2018-05-02 NOTE — Telephone Encounter (Signed)
Called patient she reports she began having left sided chest pain that radiated into her back and left arm Saturday night and into Sunday morning. She did take one sublingual nitroglycerin and has some relief of pain. She denies pain right now however she went to Dr. Anders Grant office today and they did a ekg and advised her to go to the emergency department. She reports she is going to [facility] and having her son driving her. Dr. Tomie China informed and agrees.

## 2018-05-02 NOTE — Telephone Encounter (Signed)
Patient has been having some chest pain about 3 days ago and she is having some issues with soreness with she breaths.. she states that it is sore and she just had a cath and it showed some blockages. She is seeing Shary Decamp this am and she has been coughing a lot.

## 2018-05-02 NOTE — Telephone Encounter (Signed)
A nurse from Dr. Anders Grant office called and stated that pt was there in the office and  have been dealing with chest pain and SOB over the weekend. Nurse is aware of the Cath that was just done and wanted to be sure she had a follow up with the Dr. Rock Nephew has follow up with [facility] on the 05/12/2018. Nurse states that she will send her to the ER if syptoms are still active.

## 2018-05-12 ENCOUNTER — Ambulatory Visit: Payer: Medicare Other | Admitting: Cardiology

## 2018-06-02 DIAGNOSIS — N183 Chronic kidney disease, stage 3 unspecified: Secondary | ICD-10-CM

## 2018-06-02 HISTORY — DX: Chronic kidney disease, stage 3 unspecified: N18.30

## 2018-07-27 ENCOUNTER — Telehealth: Payer: Self-pay

## 2018-07-27 NOTE — Telephone Encounter (Signed)
Megan Mcgrath from Dr. Shary Decamp called because patient states she was told to discontinue a medication due to kidney damage. RN looked over last BMP results, last office note and last encounter note and no information listed to support medication change or kidney dysfunction.

## 2018-08-04 ENCOUNTER — Telehealth: Payer: Self-pay

## 2018-08-04 NOTE — Telephone Encounter (Signed)
Call both patient contact numbers and [facility] [employer] contact person. Mailboxes are full and unable to leave a message. Left message on Ricky Dunn vm to have patient call office.

## 2018-08-08 ENCOUNTER — Ambulatory Visit: Payer: Medicare Other | Admitting: Cardiology

## 2018-10-25 DIAGNOSIS — I700 Atherosclerosis of aorta: Secondary | ICD-10-CM

## 2018-10-25 HISTORY — DX: Atherosclerosis of aorta: I70.0

## 2019-01-11 DIAGNOSIS — G3184 Mild cognitive impairment, so stated: Secondary | ICD-10-CM | POA: Insufficient documentation

## 2019-01-11 HISTORY — DX: Mild cognitive impairment of uncertain or unknown etiology: G31.84

## 2019-01-12 DIAGNOSIS — I639 Cerebral infarction, unspecified: Secondary | ICD-10-CM

## 2019-01-12 DIAGNOSIS — G9389 Other specified disorders of brain: Secondary | ICD-10-CM

## 2019-01-12 HISTORY — DX: Cerebral infarction, unspecified: I63.9

## 2019-01-12 HISTORY — DX: Other specified disorders of brain: G93.89

## 2019-06-14 DIAGNOSIS — N390 Urinary tract infection, site not specified: Secondary | ICD-10-CM | POA: Insufficient documentation

## 2019-06-14 DIAGNOSIS — R001 Bradycardia, unspecified: Secondary | ICD-10-CM

## 2019-06-14 HISTORY — DX: Bradycardia, unspecified: R00.1

## 2019-06-14 HISTORY — DX: Urinary tract infection, site not specified: N39.0

## 2019-07-06 DIAGNOSIS — I480 Paroxysmal atrial fibrillation: Secondary | ICD-10-CM | POA: Insufficient documentation

## 2019-07-06 HISTORY — DX: Paroxysmal atrial fibrillation: I48.0

## 2019-07-29 DIAGNOSIS — R079 Chest pain, unspecified: Secondary | ICD-10-CM

## 2019-07-29 DIAGNOSIS — I480 Paroxysmal atrial fibrillation: Secondary | ICD-10-CM | POA: Diagnosis not present

## 2019-07-29 DIAGNOSIS — I10 Essential (primary) hypertension: Secondary | ICD-10-CM

## 2019-07-30 DIAGNOSIS — R079 Chest pain, unspecified: Secondary | ICD-10-CM | POA: Diagnosis not present

## 2019-07-30 DIAGNOSIS — E785 Hyperlipidemia, unspecified: Secondary | ICD-10-CM | POA: Diagnosis not present

## 2019-07-30 DIAGNOSIS — I10 Essential (primary) hypertension: Secondary | ICD-10-CM

## 2019-07-30 DIAGNOSIS — I480 Paroxysmal atrial fibrillation: Secondary | ICD-10-CM

## 2019-07-30 DIAGNOSIS — I2510 Atherosclerotic heart disease of native coronary artery without angina pectoris: Secondary | ICD-10-CM | POA: Diagnosis not present

## 2020-04-03 ENCOUNTER — Other Ambulatory Visit: Payer: Self-pay

## 2020-04-03 DIAGNOSIS — G463 Brain stem stroke syndrome: Secondary | ICD-10-CM | POA: Insufficient documentation

## 2020-04-03 DIAGNOSIS — Z8619 Personal history of other infectious and parasitic diseases: Secondary | ICD-10-CM | POA: Insufficient documentation

## 2020-04-03 DIAGNOSIS — Q211 Atrial septal defect, unspecified: Secondary | ICD-10-CM | POA: Insufficient documentation

## 2020-04-03 DIAGNOSIS — G8929 Other chronic pain: Secondary | ICD-10-CM | POA: Insufficient documentation

## 2020-04-03 DIAGNOSIS — I639 Cerebral infarction, unspecified: Secondary | ICD-10-CM | POA: Insufficient documentation

## 2020-04-03 DIAGNOSIS — Z0389 Encounter for observation for other suspected diseases and conditions ruled out: Secondary | ICD-10-CM | POA: Insufficient documentation

## 2020-04-03 DIAGNOSIS — N816 Rectocele: Secondary | ICD-10-CM | POA: Insufficient documentation

## 2020-04-03 DIAGNOSIS — M5414 Radiculopathy, thoracic region: Secondary | ICD-10-CM | POA: Insufficient documentation

## 2020-04-03 DIAGNOSIS — Z862 Personal history of diseases of the blood and blood-forming organs and certain disorders involving the immune mechanism: Secondary | ICD-10-CM | POA: Insufficient documentation

## 2020-04-03 DIAGNOSIS — R410 Disorientation, unspecified: Secondary | ICD-10-CM | POA: Insufficient documentation

## 2020-04-03 DIAGNOSIS — I629 Nontraumatic intracranial hemorrhage, unspecified: Secondary | ICD-10-CM | POA: Insufficient documentation

## 2020-04-04 ENCOUNTER — Ambulatory Visit: Payer: Medicare Other | Admitting: Cardiology

## 2020-04-09 ENCOUNTER — Ambulatory Visit: Payer: Medicare Other | Admitting: Cardiology

## 2020-04-10 ENCOUNTER — Ambulatory Visit (INDEPENDENT_AMBULATORY_CARE_PROVIDER_SITE_OTHER): Payer: Medicare Other | Admitting: Cardiology

## 2020-04-10 ENCOUNTER — Encounter: Payer: Self-pay | Admitting: Cardiology

## 2020-04-10 ENCOUNTER — Other Ambulatory Visit: Payer: Self-pay

## 2020-04-10 VITALS — BP 126/66 | HR 75 | Ht 65.0 in | Wt 152.6 lb

## 2020-04-10 DIAGNOSIS — I2510 Atherosclerotic heart disease of native coronary artery without angina pectoris: Secondary | ICD-10-CM

## 2020-04-10 DIAGNOSIS — E782 Mixed hyperlipidemia: Secondary | ICD-10-CM

## 2020-04-10 DIAGNOSIS — Z8673 Personal history of transient ischemic attack (TIA), and cerebral infarction without residual deficits: Secondary | ICD-10-CM

## 2020-04-10 DIAGNOSIS — I10 Essential (primary) hypertension: Secondary | ICD-10-CM

## 2020-04-10 DIAGNOSIS — I480 Paroxysmal atrial fibrillation: Secondary | ICD-10-CM | POA: Diagnosis not present

## 2020-04-10 NOTE — Progress Notes (Signed)
Cardiology Office Note:    Date:  04/10/2020   ID:  Megan Mcgrath, DOB [birth year], MRN [id number]  PCP:  Gordan Payment., MD  Cardiologist:  Garwin Brothers, MD   Referring MD: Gordan Payment., MD    ASSESSMENT:    1. CAD in native artery   2. Essential (primary) hypertension   3. Mixed hyperlipidemia   4. Paroxysmal atrial fibrillation (HCC)   5. History of stroke    PLAN:    In order of problems listed above:  1. Paroxysmal atrial fibrillation:I discussed with the patient atrial fibrillation, disease process. Management and therapy including rate and rhythm control, anticoagulation benefits and potential risks were discussed extensively with the patient. Patient had multiple questions which were answered to patient's satisfaction. 2. Essential hypertension: Blood pressure stable and diet was emphasized. 3. Mixed dyslipidemia: On statin therapy and managed by primary care doctor. 4. Will be checked infection: Recent happening. She was treated and feels much better. 5. History of stroke: Stable at this time. 6. Renal insufficiency: I reviewed baseline lab work done by primary care physician. Adequate hydration was emphasized should be back in a week for a follow-up Chem-7. 7. Patient will be seen in follow-up appointment in 6 weeks or earlier if the patient has any concerns    Medication Adjustments/Labs and Tests Ordered: Current medicines are reviewed at length with the patient today.  Concerns regarding medicines are outlined above.  No orders of the defined types were placed in this encounter.  No orders of the defined types were placed in this encounter.    No chief complaint on file.    History of Present Illness:    Megan Mcgrath is a 80 y.o. female. Patient has past medical history of paroxysmal atrial fibrillation, coronary artery disease essential hypertension and mixed dyslipidemia. She denies any problems at this time and takes care of activities of  daily living. No chest pain orthopnea or PND. She went to the emergency room recently and was found to have atrial fibrillation with elevated ventricular rate. It was also noted that she had urinary tract infection she was treated for this and released and subsequently has done well. She denies any palpitations or any such issues at this time. She is comfortable.  Past Medical History:  Diagnosis Date  . Angina pectoris (HCC) 04/14/2018  . Anxiety disorder 04/08/2015  . Atherosclerosis of abdominal aorta (HCC) 10/25/2018   Formatting of this note might be different from the original. Seen on CT 10/2018.  Marland Kitchen Atrial septal defect   . Bradycardia 06/14/2019  . Brain stem stroke syndrome   . CAD in native artery 04/08/2015   Formatting of this note might be different from the original. Moderate diffuse disease by cath 05/2013 and 40% left main stenosis (Note name in [facility] is Eric Form)  . Cerebral thrombosis with cerebral infarction (HCC) 10/14/2010  . Chronic back pain   . Chronic constipation 12/09/2016  . Chronic coronary artery disease 04/08/2015   Moderate diffuse disease by cath 05/2013 and 40% left main stenosis (Note name in [facility] is Eric Form)  . CKD (chronic kidney disease) stage 3, GFR 30-59 ml/min (HCC) 06/02/2018  . Degenerative lumbar disc 03/19/2017   Formatting of this note might be different from the original. Surgery in past.  . Delirium   . Diverticulosis of large intestine 12/13/2015  . Dizziness 06/03/2015   Last Assessment & Plan:  Formatting of this note might be different from  the original. Relevant Hx: Course: Daily Update: Today's Plan:she is going to have the meclizine refilled for her today and reviewed with her that her BP being so elevated could easily have the same effect on her  Electronically signed by: Krystal Clark, NP 06/03/15 2029  . Dyslipidemia   . 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 Essential (primary) hypertension  04/08/2015   Last Assessment & Plan:  Relevant Hx: Course: Daily Update: Today's Plan:she has been off of the lisinopril for about 2-3 years now and would resume the lisinopril for her at 10mg  and she is on rhythmol for her rate control and she had thought it was her BP med  Electronically signed by: , NP 06/03/15 1013 Last Assessment & Plan:  Relevant Hx: Course: Daily Update: Today'  . Genital herpes simplex 09/16/2016  . GERD without esophagitis 04/08/2015  . H/O herpes genitalis   . High risk medication use 04/08/2015  . History of anemia   . History of stroke 09/16/2016   Seen on MR. Saw neurology.  Formatting of this note might be different from the original. Seen on MR. Saw neurology.  09/18/2016 and fatigue 09/11/2015  . Migraine headache 04/08/2015   infreqquent.  Last Assessment & Plan:  Relevant Hx: Course: Daily Update: Today's Plan: will refill this for her. She will also have kenalog injection today and see if this is helpful to suppress this for her, since she is going on day 4 of this. i do not think she is having a stroke her neuro exam is normal completely today. I reassured her on this, however, she knows that if the h/a is "thunderc  . Mild cognitive impairment 01/11/2019  . Mixed hyperlipidemia 04/08/2015  . 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 Overview:  Bx 2010 Formatting of this note might be different from the original. Bx 2010  . Nontraumatic intracranial hemorrhage (HCC)   . Observation for suspected malignant neoplasm   . Osteopenia 04/08/2015   2017.  Repeat in 2021  Formatting of this note might be different from the original. 2017.  Repeat in 2021  . Paroxysmal atrial fibrillation (HCC) 07/06/2019  . Pre-operative cardiovascular examination 09/10/2015  . Primary insomnia 04/08/2015  . Rectocele   . Recurrent major depression (HCC) 04/08/2015  . Recurrent UTI 06/14/2019  . S/P  percutaneous patent foramen ovale closure 04/08/2015   2009 Goretex Helix PFO closure  Formatting of this note might be different from the original. 2009 Goretex Helix PFO closure  . Stroke (HCC)   . Thoracic neuritis     Past Surgical History:  Procedure Laterality Date  . BACK SURGERY    . BLADDER NECK RECONSTRUCTION    . COLON SURGERY    . COLONOSCOPY W/ POLYPECTOMY    . HYSTERECTOMY    . INTRAVASCULAR PRESSURE WIRE/FFR STUDY N/A 04/19/2018   Procedure: INTRAVASCULAR PRESSURE WIRE/FFR STUDY;  Surgeon: 04/21/2018, MD;  Location[facility] INVASIVE CV LAB;  Service: Cardiovascular;  Laterality: N/A;  . LEFT HEART CATH AND CORONARY ANGIOGRAPHY N/A 04/19/2018   Procedure: LEFT HEART CATH AND CORONARY ANGIOGRAPHY;  Surgeon: 04/21/2018, MD;  Location: [facility] INVASIVE CV LAB;  Service: Cardiovascular;  Laterality: N/A;  . repair congenital atrial septal defect    . SHOULDER SURGERY      Current Medications: Current Meds  Medication Sig  . aspirin 81 MG tablet  Take 81 mg by mouth daily.  Marland Kitchen atorvastatin (LIPITOR) 20 MG tablet Take 20 mg by mouth at bedtime.   . Calcium Carbonate-Vitamin D 600-200 MG-UNIT TABS Take 1 tablet by mouth daily.  . Cholecalciferol 25 MCG (1000 UT) tablet Take 1,000 Units by mouth daily.  Marland Kitchen escitalopram (LEXAPRO) 5 MG tablet Take 5 mg by mouth daily.  Marland Kitchen lisinopril (ZESTRIL) 10 MG tablet Take 10 mg by mouth 2 (two) times daily.  . nitroGLYCERIN (NITROSTAT) 0.4 MG SL tablet Place 0.4 mg under the tongue every 5 (five) minutes as needed for chest pain.  . Omega-3 1000 MG CAPS Take 1,000 mg by mouth 2 (two) times daily.   . [DISCONTINUED] nitroGLYCERIN (NITROSTAT) 0.4 MG SL tablet Place 1 tablet (0.4 mg total) under the tongue every 5 (five) minutes as needed.     Allergies:   Phenazopyridine   Social History   Socioeconomic History  . Marital status: Married    Spouse name: Not on file  . Number of children: Not on file  . Years of education: Not on  file  . Highest education level: Not on file  Occupational History  . Not on file  Tobacco Use  . Smoking status: Never Smoker  . Smokeless tobacco: Never Used  Substance and Sexual Activity  . Alcohol use: Never  . Drug use: Never  . Sexual activity: Not on file  Other Topics Concern  . Not on file  Social History Narrative  . Not on file   Social Determinants of Health   Financial Resource Strain: Not on file  Food Insecurity: Not on file  Transportation Needs: Not on file  Physical Activity: Not on file  Stress: Not on file  Social Connections: Not on file     Family History: The patient's family history includes Heart disease in her father, maternal grandmother, and mother.  ROS:   Please see the history of present illness.    All other systems reviewed and are negative.  EKGs/Labs/Other Studies Reviewed:    The following studies were reviewed today: INTRAVASCULAR PRESSURE WIRE/FFR STUDY  LEFT HEART CATH AND CORONARY ANGIOGRAPHY    Conclusion    Mid RCA lesion is 60% stenosed. Not significant by FFR.  Mid LM to Dist LM lesion is 30% stenosed. Based no the chart, this lesion was present previously. Old films not available.  Prox LAD lesion is 40% stenosed.  Ost 1st Diag to 1st Diag lesion is 40% stenosed.  Ost Cx to Prox Cx lesion is 50-60% stenosed.  The left ventricular systolic function is normal.  LV end diastolic pressure is normal. LVEDP 5 mm Hg.  The left ventricular ejection fraction is 55-65% by visual estimate.  There is no aortic valve stenosis.   Moderate diffuse CAD.  Increase medical therapy to treat angina.  Circumflex lesion is at the ostium and PCI could compromise the LAD ostium.  Radial artery spasm noted.  Some difficulty advancing a 6 Fr catheter.     Recent Labs: No results found for requested labs within last 8760 hours.  Recent Lipid Panel No results found for: CHOL, TRIG, HDL, CHOLHDL, VLDL, LDLCALC,  LDLDIRECT  Physical Exam:    VS:  BP 126/66   Pulse 75   Ht 5\' 5"  (1.651 m)   Wt 152 lb 9.6 oz (69.2 kg)   SpO2 94%   BMI 25.39 kg/m     Wt Readings from Last 3 Encounters:  04/10/20 152 lb 9.6 oz (69.2 kg)  04/19/18 164 lb (74.4 kg)  04/14/18 165 lb (74.8 kg)     GEN: Patient is in no acute distress HEENT: Normal NECK: No JVD; No carotid bruits LYMPHATICS: No lymphadenopathy CARDIAC: Hear sounds regular, 2/6 systolic murmur at the apex. RESPIRATORY:  Clear to auscultation without rales, wheezing or rhonchi  ABDOMEN: Soft, non-tender, non-distended MUSCULOSKELETAL:  No edema; No deformity  SKIN: Warm and dry NEUROLOGIC:  Alert and oriented x 3 PSYCHIATRIC:  Normal affect   Signed, Garwin Brothers, MD  04/10/2020 3:07 PM    [facility]

## 2020-04-10 NOTE — Addendum Note (Signed)
Addended by: Eleonore Chiquito on: 04/10/2020 03:23 PM   Modules accepted: Orders

## 2020-04-10 NOTE — Patient Instructions (Signed)

## 2020-07-10 ENCOUNTER — Ambulatory Visit: Payer: Medicare Other | Admitting: Cardiology

## 2020-12-04 ENCOUNTER — Ambulatory Visit: Payer: Medicare Other | Admitting: Cardiology

## 2021-07-07 DIAGNOSIS — I959 Hypotension, unspecified: Secondary | ICD-10-CM | POA: Diagnosis not present
